# Patient Record
Sex: Male | Born: 1985 | Hispanic: Yes | Marital: Single | Smoking: Former smoker
Health system: Southern US, Community
[De-identification: ages and names within clinical notes are randomized; demographics above are authoritative.]

## PROBLEM LIST (undated history)

## (undated) DIAGNOSIS — H5702 Anisocoria: Secondary | ICD-10-CM

## (undated) DIAGNOSIS — H5213 Myopia, bilateral: Secondary | ICD-10-CM

## (undated) HISTORY — DX: Anisocoria: H57.02

## (undated) HISTORY — DX: Myopia, bilateral: H52.13

---

## 2010-11-23 ENCOUNTER — Encounter (HOSPITAL_COMMUNITY): Payer: Self-pay | Admitting: Radiology

## 2010-11-23 ENCOUNTER — Emergency Department (HOSPITAL_COMMUNITY): Payer: No Typology Code available for payment source

## 2010-11-23 ENCOUNTER — Inpatient Hospital Stay (HOSPITAL_COMMUNITY)
Admission: EM | Admit: 2010-11-23 | Discharge: 2010-11-27 | DRG: 982 | Disposition: A | Discharge status: Home Health | Payer: No Typology Code available for payment source | Attending: Surgery | Admitting: Surgery

## 2010-11-23 ENCOUNTER — Emergency Department (HOSPITAL_COMMUNITY): Payer: Self-pay

## 2010-11-23 DIAGNOSIS — D62 Acute posthemorrhagic anemia: Secondary | ICD-10-CM

## 2010-11-23 DIAGNOSIS — S92109A Unspecified fracture of unspecified talus, initial encounter for closed fracture: Secondary | ICD-10-CM | POA: Diagnosis present

## 2010-11-23 DIAGNOSIS — S8253XA Displaced fracture of medial malleolus of unspecified tibia, initial encounter for closed fracture: Secondary | ICD-10-CM | POA: Diagnosis present

## 2010-11-23 DIAGNOSIS — S1093XA Contusion of unspecified part of neck, initial encounter: Secondary | ICD-10-CM

## 2010-11-23 DIAGNOSIS — S82899A Other fracture of unspecified lower leg, initial encounter for closed fracture: Secondary | ICD-10-CM

## 2010-11-23 DIAGNOSIS — S0083XA Contusion of other part of head, initial encounter: Secondary | ICD-10-CM

## 2010-11-23 DIAGNOSIS — S060X9A Concussion with loss of consciousness of unspecified duration, initial encounter: Principal | ICD-10-CM | POA: Diagnosis present

## 2010-11-23 DIAGNOSIS — H5702 Anisocoria: Secondary | ICD-10-CM

## 2010-11-23 DIAGNOSIS — I959 Hypotension, unspecified: Secondary | ICD-10-CM | POA: Diagnosis present

## 2010-11-23 DIAGNOSIS — S0003XA Contusion of scalp, initial encounter: Secondary | ICD-10-CM

## 2010-11-23 DIAGNOSIS — F1410 Cocaine abuse, uncomplicated: Secondary | ICD-10-CM | POA: Diagnosis present

## 2010-11-23 DIAGNOSIS — F1010 Alcohol abuse, uncomplicated: Secondary | ICD-10-CM | POA: Diagnosis present

## 2010-11-23 DIAGNOSIS — S060XAA Concussion with loss of consciousness status unknown, initial encounter: Principal | ICD-10-CM | POA: Diagnosis present

## 2010-11-23 DIAGNOSIS — S0100XA Unspecified open wound of scalp, initial encounter: Secondary | ICD-10-CM | POA: Diagnosis present

## 2010-11-23 LAB — CBC
HCT: 40.2 % (ref 39.0–52.0)
HCT: 33.9 % — ABNORMAL LOW (ref 39.0–52.0)
Hemoglobin: 14.1 g/dL (ref 13.0–17.0)
Hemoglobin: 11.5 g/dL — ABNORMAL LOW (ref 13.0–17.0)
MCH: 28.4 pg (ref 26.0–34.0)
MCHC: 35.1 g/dL (ref 30.0–36.0)
MCHC: 33.9 g/dL (ref 30.0–36.0)
RBC: 4.85 MIL/uL (ref 4.22–5.81)
RDW: 12.7 % (ref 11.5–15.5)
WBC: 11.6 10*3/uL — ABNORMAL HIGH (ref 4.0–10.5)

## 2010-11-23 LAB — POCT I-STAT, CHEM 8
Calcium, Ion: 1.05 mmol/L — ABNORMAL LOW (ref 1.12–1.32)
Chloride: 105 mEq/L (ref 96–112)
Creatinine, Ser: 1.2 mg/dL (ref 0.50–1.35)
Creatinine, Ser: 1.1 mg/dL (ref 0.50–1.35)
Glucose, Bld: 176 mg/dL — ABNORMAL HIGH (ref 70–99)
HCT: 45 % (ref 39.0–52.0)
Hemoglobin: 15.3 g/dL (ref 13.0–17.0)
Potassium: 3.4 mEq/L — ABNORMAL LOW (ref 3.5–5.1)
Potassium: 4 mEq/L (ref 3.5–5.1)
Sodium: 138 mEq/L (ref 135–145)
TCO2: 21 mmol/L (ref 0–100)

## 2010-11-23 LAB — DIFFERENTIAL
Basophils Absolute: 0 10*3/uL (ref 0.0–0.1)
Basophils Relative: 0 % (ref 0–1)
Lymphocytes Relative: 23 % (ref 12–46)
Neutro Abs: 8.3 10*3/uL — ABNORMAL HIGH (ref 1.7–7.7)
Neutrophils Relative %: 72 % (ref 43–77)

## 2010-11-23 LAB — ETHANOL: Alcohol, Ethyl (B): 263 mg/dL — ABNORMAL HIGH (ref 0–11)

## 2010-11-23 MED ORDER — IOHEXOL 300 MG/ML  SOLN
100.0000 mL | Freq: Once | INTRAMUSCULAR | Status: AC | PRN
  Administered 2010-11-23: 100 mL via INTRAVENOUS

## 2010-11-24 LAB — BASIC METABOLIC PANEL
BUN: 6 mg/dL (ref 6–23)
Calcium: 8.7 mg/dL (ref 8.4–10.5)
Creatinine, Ser: 0.7 mg/dL (ref 0.50–1.35)
GFR calc non Af Amer: >60 mL/min (ref >60)
Glucose, Bld: 113 mg/dL — ABNORMAL HIGH (ref 70–99)

## 2010-11-24 LAB — CBC
HCT: 31.1 % — ABNORMAL LOW (ref 39.0–52.0)
Hemoglobin: 10.7 g/dL — ABNORMAL LOW (ref 13.0–17.0)
MCH: 28.9 pg (ref 26.0–34.0)
MCHC: 34.4 g/dL (ref 30.0–36.0)
MCV: 84.1 fL (ref 78.0–100.0)
RDW: 12.9 % (ref 11.5–15.5)

## 2010-11-24 LAB — TYPE AND SCREEN
ABO/RH(D): O POS
Antibody Screen: NEGATIVE
Unit division: 0

## 2010-11-25 HISTORY — PX: ANKLE SURGERY: SHX546

## 2010-11-25 LAB — CBC
Hemoglobin: 10.3 g/dL — ABNORMAL LOW (ref 13.0–17.0)
MCH: 28.8 pg (ref 26.0–34.0)
MCHC: 34.3 g/dL (ref 30.0–36.0)
Platelets: 246 10*3/uL (ref 150–400)
RDW: 12.3 % (ref 11.5–15.5)

## 2010-11-25 LAB — BASIC METABOLIC PANEL
Calcium: 8.7 mg/dL (ref 8.4–10.5)
GFR calc Af Amer: >60 mL/min (ref >60)
GFR calc non Af Amer: >60 mL/min (ref >60)
Glucose, Bld: 115 mg/dL — ABNORMAL HIGH (ref 70–99)
Sodium: 133 mEq/L — ABNORMAL LOW (ref 135–145)
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
## 2010-12-05 ENCOUNTER — Ambulatory Visit (INDEPENDENT_AMBULATORY_CARE_PROVIDER_SITE_OTHER): Payer: Self-pay | Admitting: Physician Assistant

## 2010-12-05 ENCOUNTER — Encounter (INDEPENDENT_AMBULATORY_CARE_PROVIDER_SITE_OTHER): Payer: Self-pay

## 2010-12-05 VITALS — BP 132/80 | HR 72 | Temp 97.8°F | Resp 18 | Ht 68.0 in | Wt 230.0 lb

## 2010-12-05 DIAGNOSIS — S0100XA Unspecified open wound of scalp, initial encounter: Secondary | ICD-10-CM

## 2010-12-05 DIAGNOSIS — S0101XA Laceration without foreign body of scalp, initial encounter: Secondary | ICD-10-CM

## 2010-12-05 NOTE — Patient Instructions (Signed)
Follow up with Dr. Lajoyce Corners on 12/09/2010 at 1:30pm   Dr. Audrie Lia office number is [phone] follow up with Trauma Service as needed.
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
## 2013-07-01 ENCOUNTER — Other Ambulatory Visit: Payer: Self-pay | Admitting: Orthopaedic Surgery

## 2013-07-01 ENCOUNTER — Ambulatory Visit
Admission: RE | Admit: 2013-07-01 | Discharge: 2013-07-01 | Disposition: A | Discharge status: Home / Self Care | Payer: BC Managed Care – PPO | Source: Ambulatory Visit | Attending: Orthopaedic Surgery | Admitting: Orthopaedic Surgery

## 2013-07-01 DIAGNOSIS — L0291 Cutaneous abscess, unspecified: Secondary | ICD-10-CM

## 2013-07-01 DIAGNOSIS — R52 Pain, unspecified: Secondary | ICD-10-CM

## 2015-07-17 IMAGING — CT CT ANKLE*L* WO/W CM
1 of 9 series · 2 of 14 positions shown, 3 images · IV contrast (omnipaque)
Comparison: 11/23/2010.

CLINICAL DATA: Lateral ankle pain.  Abscess.

EXAM:
CT OF THE LEFT ANKLE WITHOUT AND WITH CONTRAST
TECHNIQUE: Multidetector CT imaging of the left ankle was performed according
to the standard protocol before and following intravenous contrast
administration. Multiplanar CT image reconstructions were also
generated.
CONTRAST:  75 mL Omnipaque 300.

[Series 5: ankle/foot bone · axial · 0.39mm/px · z∈[-107,+93]mm · 2 of 81 slices shown, 3 images]
[im 1/81  soft-tissue]
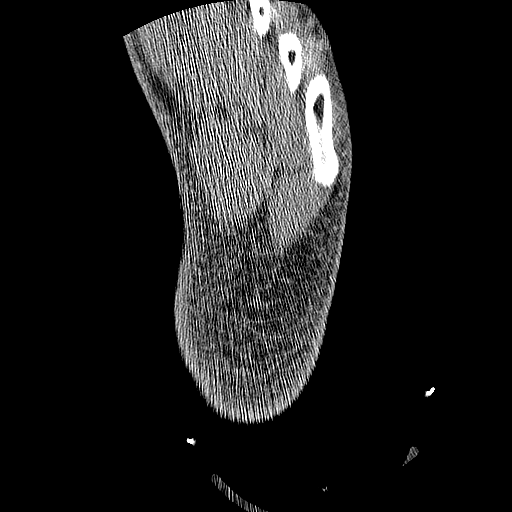
[im 1/81  bone]
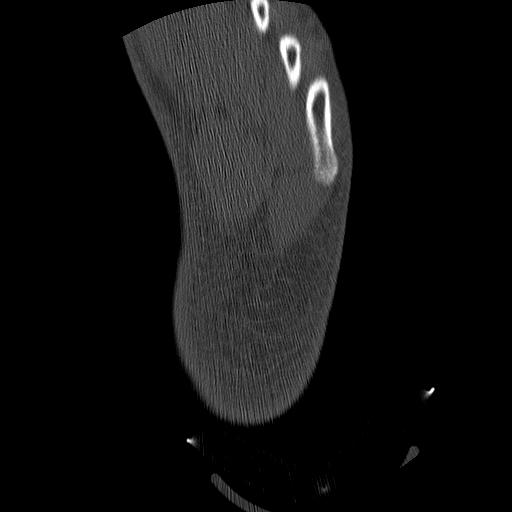
[im 81/81  bone]
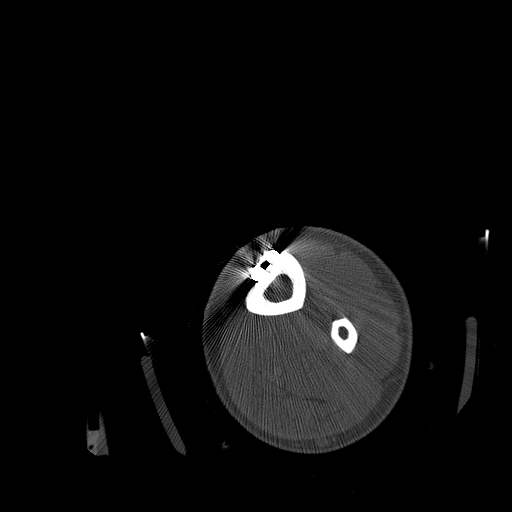

[2 of 14 positions shown; findings below may reference images not displayed]

FINDINGS: Old ORIF of the distal tibia with plate and screw fixation. Medial
malleolar screw is present. There is a broken K-wire at the inferior
margin of the plate, likely used to secure the plate during
placement. Tiny avulsion fracture fragments are present adjacent to
the lateral malleolus. There is no abscess.

There is no evidence of hardware infection. The ankle mortise is
congruent. The talar dome is intact. There is no AVN or
osteochondral lesion identified. Subtalar joints appear normal. The
posterior tibial tendon appears swollen, suggesting tendinopathy
and/or tenosynovitis. The peroneal tendons grossly appear within
normal limits. Anterior tendon group appears normal as well.
Posttraumatic osteoarthritis of the ankle is present with anterior
tibial spurs. Tiny loose body is present in the anterior lateral
aspect of the joint and avulsion fragments are present both along
the anterior and posterior talofibular ligaments.

Mild subcutaneous edema is present.
IMPRESSION: 1. Negative for abscess or evidence of hardware infection. Healed
Ankle fractures.
2. Swelling of the posterior tibial tendon which may represent
tendinosis or tenosynovitis.

## 2020-09-24 ENCOUNTER — Encounter: Payer: Self-pay | Admitting: *Deleted

## 2020-09-25 ENCOUNTER — Other Ambulatory Visit: Payer: Self-pay

## 2020-09-25 ENCOUNTER — Ambulatory Visit (INDEPENDENT_AMBULATORY_CARE_PROVIDER_SITE_OTHER): Payer: Self-pay | Admitting: Neurology

## 2020-09-25 ENCOUNTER — Encounter: Payer: Self-pay | Admitting: Neurology

## 2020-09-25 VITALS — BP 144/89 | HR 58 | Ht 69.0 in | Wt 228.5 lb

## 2020-09-25 DIAGNOSIS — H5702 Anisocoria: Secondary | ICD-10-CM

## 2020-09-25 DIAGNOSIS — H5213 Myopia, bilateral: Secondary | ICD-10-CM

## 2020-09-25 DIAGNOSIS — H52203 Unspecified astigmatism, bilateral: Secondary | ICD-10-CM
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
## 2022-07-16 ENCOUNTER — Emergency Department: Admission: EM | Admit: 2022-07-16 | Discharge: 2022-07-16 | Discharge status: Against Medical Advice | Payer: Self-pay

## 2022-07-17 ENCOUNTER — Other Ambulatory Visit: Payer: Self-pay

## 2022-07-17 ENCOUNTER — Emergency Department
Admission: EM | Admit: 2022-07-17 | Discharge: 2022-07-17 | Disposition: A | Discharge status: Home / Self Care | Payer: Self-pay | Attending: Emergency Medicine | Admitting: Emergency Medicine

## 2022-07-17 ENCOUNTER — Encounter: Payer: Self-pay | Admitting: Emergency Medicine

## 2022-07-17 ENCOUNTER — Emergency Department: Payer: Self-pay

## 2022-07-17 DIAGNOSIS — Y92410 Unspecified street and highway as the place of occurrence of the external cause: Secondary | ICD-10-CM | POA: Insufficient documentation

## 2022-07-17 DIAGNOSIS — S40212A Abrasion of left shoulder, initial encounter: Secondary | ICD-10-CM | POA: Insufficient documentation

## 2022-07-17 DIAGNOSIS — R072 Precordial pain: Secondary | ICD-10-CM | POA: Insufficient documentation
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
## 2022-07-17 NOTE — Discharge Instructions (Signed)
Please take Tylenol and ibuprofen/Advil for your pain.  It is safe to take them together, or to alternate them every few hours.  Take up to 1000mg of Tylenol at a time, up to 4 times per day.  Do not take more than 4000 mg of Tylenol in 24 hours.  For ibuprofen, take 400-600 mg, 3 - 4 times per day.  
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
## 2022-07-17 NOTE — ED Notes (Signed)
Patient discharged at this time. Ambulated to lobby with independent and steady gait. Breathing unlabored speaking in full sentences. Verbalized understanding of all discharge, follow up, and medication teaching. Discharged with [facility] and with all belongings.

## 2022-07-17 NOTE — ED Notes (Signed)
pt A&Ox3, pt voices good understanding of blood draw to be performed for forensic testing; pt verifies identity with name and DOB and consent signed; using sealed kit provided by officer, tourniquet applied to left upper arm; left antecubital region prepped with betadine swab and allowed to dry completely; needle inserted and 2 grey top blood tubes collected; tourniquet removed, needle removed & intact, dressing applied; tubes labeled, given to officer and placed in sealed container using chain of custody; pt tolerated well

## 2022-07-30 ENCOUNTER — Encounter: Payer: Self-pay | Admitting: Emergency Medicine

## 2022-07-30 ENCOUNTER — Ambulatory Visit
Admission: EM | Admit: 2022-07-30 | Discharge: 2022-07-30 | Disposition: A | Discharge status: Home / Self Care | Payer: Self-pay | Attending: Nurse Practitioner | Admitting: Nurse Practitioner

## 2022-07-30 DIAGNOSIS — L03012 Cellulitis of left finger: Secondary | ICD-10-CM

## 2022-07-30 MED ORDER — DOXYCYCLINE HYCLATE 100 MG PO CAPS: 100.0000 mg | ORAL_CAPSULE | Freq: Two times a day (BID) | ORAL | 0 refills | Status: AC

## 2022-07-30 NOTE — Discharge Instructions (Addendum)
You have been prescribed doxycycline 100 mg twice daily for 10 days.  The recommendation is to complete the antibiotic as directed.  There has been a culture sent to provide [place] with what type of infection we are treating and to ensure this is the most effective treatment.  You will be notified if the antibiotic needs to be changed for any reason.  If the symptoms persist and does not improve you are to follow back up with urgent care for reevaluation.

## 2022-07-30 NOTE — ED Triage Notes (Signed)
Left ring finger swollen with pus drainage around the nailbed x 5 days.

## 2022-07-30 NOTE — ED Provider Notes (Addendum)
[facility]    CSN: [id number] Arrival date & time: 07/30/22  1528      History   Chief Complaint No chief complaint on file.   HPI Sean Grant is a 37 y.o. male.   HPI He is in today to evaluate swelling to his finger.  He reports that he may have gotten a splinter in his finger however he is not sure.  He know he has been having some pain and swelling.  He reports that he was seen by the safety personnel at his job and they are noticed white areas that have developed to the finger.  He was encouraged to be seen.  He denies any fever, chills.  He is not having active drainage.  He has been cleaning it with peroxide along with topical ointment without success.  He denies any additional injury.  He does work full-time his job varies.  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 Currently on Past Medical History:  Diagnosis Date   Anisocoria    Myopia of both eyes     There are no problems to display for this patient.   Past Surgical History:  Procedure Laterality Date   ANKLE SURGERY  November 25 2010   bilateral        Home Medications    Prior to Admission medications   Medication Sig Start Date End Date Taking? Authorizing Provider  doxycycline (VIBRAMYCIN) 100 MG capsule Take 1 capsule (100 mg total) by mouth 2 (two) times daily. 07/30/22  Yes Barbette Merino, NP    Family History History reviewed. No pertinent family history.  Social History Social History   Tobacco Use   Smoking status: Former   Smokeless tobacco: Never  Substance Use Topics   Alcohol use: Yes   Drug use: No     Allergies   Patient has no known allergies.   Review of Systems Review of Systems   Physical Exam Triage Vital Signs ED Triage Vitals  Enc Vitals Group     BP 07/30/22 1618 (!) 156/96     Pulse Rate 07/30/22 1618 64     Resp 07/30/22 1618 18     Temp 07/30/22 1618 98.8 F (37.1 C)     Temp Source 07/30/22 1618 Oral     SpO2 07/30/22 1618  98 %     Weight --      Height --      Head Circumference --      Peak Flow --      Pain Score 07/30/22 1620 3     Pain Loc --      Pain Edu? --      Excl. in GC? --    No data found.  Updated Vital Signs BP (!) 156/96 (BP Location: Right Arm)   Pulse 64   Temp 98.8 F (37.1 C) (Oral)   Resp 18   SpO2 98%   Visual Acuity Right Eye Distance:   Left Eye Distance:   Bilateral Distance:    Right Eye Near:   Left Eye Near:    Bilateral Near:     Physical Exam Constitutional:      General: He is not in acute distress. HENT:     Head: Normocephalic.  Cardiovascular:     Rate and Rhythm: Normal rate.  Pulmonary:     Effort: Pulmonary effort is normal.  Musculoskeletal:     Left hand: Swelling present. Decreased range of motion (Due to swelling  of distal joint). Normal sensation. There is no disruption of two-point discrimination. Normal capillary refill. Normal pulse.  Skin:    General: Skin is warm and dry.     Capillary Refill: Capillary refill takes 2 to 3 seconds.  Neurological:     General: No focal deficit present.     Mental Status: He is alert and oriented to person, place, and time.  Psychiatric:        Mood and Affect: Mood normal.      UC Treatments / Results  Labs (all labs ordered are listed, but only abnormal results are displayed) Labs Reviewed  AEROBIC CULTURE W GRAM STAIN (SUPERFICIAL SPECIMEN)    EKG   Radiology No results found.  Procedures Procedures (including critical care time)  Medications Ordered in UC Medications - No data to display  Initial Impression / Assessment and Plan / UC Course  I have reviewed the triage vital signs and the nursing notes.  Pertinent labs & imaging results that were available during my care of the patient were reviewed by me and considered in my medical decision making (see chart for details).     Finger infection Final Clinical Impressions(s) / UC Diagnoses   Final diagnoses:  Paronychia of  finger of left hand     Discharge Instructions      You have been prescribed doxycycline 100 mg twice daily for 10 days.  The recommendation is to complete the antibiotic as directed.  There has been a culture sent to provide [place] with what type of infection we are treating and to ensure this is the most effective treatment.  You will be notified if the antibiotic needs to be changed for any reason.  If the symptoms persist and does not improve you are to follow back up with urgent care for reevaluation.     ED Prescriptions     Medication Sig Dispense Auth. Provider   doxycycline (VIBRAMYCIN) 100 MG capsule Take 1 capsule (100 mg total) by mouth 2 (two) times daily. 20 capsule Barbette Merino, NP      PDMP not reviewed this encounter.   Barbette Merino, NP 08/01/22 1724    Barbette Merino, NP 08/01/22 [phone]

## 2022-08-01 LAB — AEROBIC CULTURE W GRAM STAIN (SUPERFICIAL SPECIMEN)

## 2022-08-02 LAB — AEROBIC CULTURE W GRAM STAIN (SUPERFICIAL SPECIMEN)

## 2022-08-04 LAB — AEROBIC CULTURE W GRAM STAIN (SUPERFICIAL SPECIMEN): Gram Stain: NONE SEEN

## 2022-08-31 ENCOUNTER — Ambulatory Visit (HOSPITAL_COMMUNITY)
Admission: EM | Admit: 2022-08-31 | Discharge: 2022-08-31 | Disposition: A | Discharge status: Home / Self Care | Payer: Self-pay | Attending: Internal Medicine | Admitting: Internal Medicine

## 2022-08-31 ENCOUNTER — Encounter (HOSPITAL_COMMUNITY): Payer: Self-pay

## 2022-08-31 DIAGNOSIS — K219 Gastro-esophageal reflux disease without esophagitis: Secondary | ICD-10-CM

## 2022-08-31 MED ORDER — OMEPRAZOLE 20 MG PO CPDR: 20.0000 mg | DELAYED_RELEASE_CAPSULE | Freq: Every day | ORAL | 0 refills | Status: AC

## 2022-08-31 NOTE — ED Provider Notes (Addendum)
[facility]    CSN: [id number] Arrival date & time: 08/31/22  1024   History   Chief Complaint Chief Complaint  Patient presents with   Chest Pain    HPI Sean Grant is a 37 y.o. male presents to urgent care today with complaint of epigastric pain, nausea and vomiting.  He describes the pain as pulsating as well as a burning sensation in his chest.  He reports this started 3 days ago after eating pasta.  He denies chest pain, chest tightness or shortness of breath.  He denies dizziness, headache, sweating or near syncope.  He has tried ibuprofen OTC with minimal relief of symptoms.  HPI  Past Medical History:  Diagnosis Date   Anisocoria    Myopia of both eyes     There are no problems to display for this patient.   Past Surgical History:  Procedure Laterality Date   ANKLE SURGERY  November 25 2010   bilateral        Home Medications    Prior to Admission medications   Medication Sig Start Date End Date Taking? Authorizing Provider  omeprazole (PRILOSEC) 20 MG capsule Take 1 capsule (20 mg total) by mouth daily. 08/31/22  Yes Dennisse Swader, Salvadore Oxford, NP  doxycycline (VIBRAMYCIN) 100 MG capsule Take 1 capsule (100 mg total) by mouth 2 (two) times daily. 07/30/22   Barbette Merino, NP    Family History History reviewed. No pertinent family history.  Social History Social History   Tobacco Use   Smoking status: Former   Smokeless tobacco: Never  Substance Use Topics   Alcohol use: Yes   Drug use: No     Allergies   Patient has no known allergies.   Review of Systems Review of Systems  Constitutional:  Positive for appetite change. Negative for chills, diaphoresis and fever.  Respiratory:  Negative for cough, chest tightness and shortness of breath.   Cardiovascular:  Negative for chest pain and palpitations.  Gastrointestinal:  Positive for abdominal pain, nausea and vomiting. Negative for abdominal distention, constipation and diarrhea.        Positive for epigastric pain  Genitourinary:  Negative for dysuria, frequency and urgency.  Musculoskeletal:  Negative for arthralgias and myalgias.  Neurological:  Negative for dizziness, weakness, light-headedness and headaches.     Physical Exam Triage Vital Signs ED Triage Vitals [08/31/22 1135]  Enc Vitals Group     BP (!) 138/93     Pulse Rate (!) 57     Resp 18     Temp 97.6 F (36.4 C)     Temp Source Oral     SpO2 97 %     Weight      Height      Head Circumference      Peak Flow      Pain Score      Pain Loc      Pain Edu?      Excl. in GC?    No data found.  Updated Vital Signs BP (!) 138/93 (BP Location: Left Arm)   Pulse (!) 57   Temp 97.6 F (36.4 C) (Oral)   Resp 18   SpO2 97%   Visual Acuity Right Eye Distance:   Left Eye Distance:   Bilateral Distance:    Right Eye Near:   Left Eye Near:    Bilateral Near:     Physical Exam Constitutional:      Appearance: He is obese.  HENT:  Head: Normocephalic.  Cardiovascular:     Rate and Rhythm: Regular rhythm. Bradycardia present.     Heart sounds: Normal heart sounds.  Pulmonary:     Effort: Pulmonary effort is normal.     Breath sounds: Normal breath sounds.  Chest:     Chest wall: No deformity or tenderness.  Abdominal:     General: Bowel sounds are normal.     Palpations: Abdomen is soft. There is no mass.     Tenderness: There is abdominal tenderness.     Comments: Pain with palpation in the epigastric area  Skin:    General: Skin is warm and dry.  Neurological:     Mental Status: He is alert and oriented to person, place, and time.      UC Treatments / Results    ED ECG REPORT   Date: 08/31/2022  Rate: 52  Rhythm: sinus bradycardia  QRS Axis: normal  Intervals: normal  ST/T Wave abnormalities: normal  Conduction Disutrbances:none  Narrative Interpretation: Sinus bradycardia, normal rhythm-no signs of ACS  Old EKG Reviewed: unchanged from 11/2010  I have personally  reviewed the EKG tracing and agree with with the computerized printout as noted.  Medications Ordered in UC Medications - No data to display  Initial Impression / Assessment and Plan / UC Course  I have reviewed the triage vital signs and the nursing notes.  Pertinent labs & imaging results that were available during my care of the patient were reviewed by me and considered in my medical decision making (see chart for details).  37 year old male presents to UC today with read today complaint of epigastric pain, nausea and vomiting.  DDx include gastroenteritis, GERD, PUD.  ECG shows sinus bradycardia without concerns for ACS.  Will trial omeprazole 20 mg daily 30 minutes before breakfast.  Handout given on diet information for GERD.  Advised him to follow-up for new or worsening symptoms.  Final Clinical Impressions(s) / UC Diagnoses   Final diagnoses:  Gastroesophageal reflux disease without esophagitis     Discharge Instructions      You were seen today for epigastric pain, nausea and vomiting.  I think this is related to reflux.  Try to identify and avoid foods that trigger reflux.  Your ECG today was normal.  I have sent in an acid reducer for you to take 30 minutes before breakfast for the next 2 weeks.  Please follow-up if your pain persist or worsen.     ED Prescriptions     Medication Sig Dispense Auth. Provider   omeprazole (PRILOSEC) 20 MG capsule Take 1 capsule (20 mg total) by mouth daily. 14 capsule Lorre Munroe, NP      PDMP not reviewed this encounter. Nicki Reaper, NP        Lorre Munroe, [place] 08/31/22 1209

## 2022-08-31 NOTE — ED Triage Notes (Addendum)
Pt is here for pain in the center of his chest. Pt stated the pain started yesterday. Pt states his heart skips a beat.  Denies any heavy lifting or trauma.

## 2022-08-31 NOTE — Discharge Instructions (Addendum)
You were seen today for epigastric pain, nausea and vomiting.  I think this is related to reflux.  Try to identify and avoid foods that trigger reflux.  Your ECG today was normal.  I have sent in an acid reducer for you to take 30 minutes before breakfast for the next 2 weeks.  Please follow-up if your pain persist or worsen.
